# Patient Record
Sex: Male | Born: 2000 | Race: Black or African American | Hispanic: No | Marital: Single | Smoking: Never smoker
Health system: Southern US, Community
[De-identification: ages and names within clinical notes are randomized; demographics above are authoritative.]

---
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
## 2021-02-01 ENCOUNTER — Emergency Department (HOSPITAL_COMMUNITY): Payer: Medicaid Other

## 2021-02-01 ENCOUNTER — Emergency Department (HOSPITAL_COMMUNITY)
Admission: EM | Admit: 2021-02-01 | Discharge: 2021-02-02 | Disposition: A | Discharge status: Home / Self Care | Payer: Medicaid Other | Attending: Emergency Medicine | Admitting: Emergency Medicine

## 2021-02-01 ENCOUNTER — Other Ambulatory Visit: Payer: Self-pay

## 2021-02-01 DIAGNOSIS — S79922A Unspecified injury of left thigh, initial encounter: Secondary | ICD-10-CM | POA: Diagnosis present

## 2021-02-01 DIAGNOSIS — R791 Abnormal coagulation profile: Secondary | ICD-10-CM | POA: Diagnosis not present

## 2021-02-01 DIAGNOSIS — Z23 Encounter for immunization: Secondary | ICD-10-CM | POA: Diagnosis not present

## 2021-02-01 DIAGNOSIS — Z20822 Contact with and (suspected) exposure to covid-19: Secondary | ICD-10-CM | POA: Insufficient documentation

## 2021-02-01 DIAGNOSIS — W3400XA Accidental discharge from unspecified firearms or gun, initial encounter: Secondary | ICD-10-CM | POA: Insufficient documentation

## 2021-02-01 DIAGNOSIS — S31819A Unspecified open wound of right buttock, initial encounter: Secondary | ICD-10-CM | POA: Insufficient documentation

## 2021-02-01 DIAGNOSIS — Y900 Blood alcohol level of less than 20 mg/100 ml: Secondary | ICD-10-CM | POA: Insufficient documentation

## 2021-02-01 DIAGNOSIS — S71102A Unspecified open wound, left thigh, initial encounter: Secondary | ICD-10-CM | POA: Diagnosis not present

## 2021-02-01 DIAGNOSIS — S71132A Puncture wound without foreign body, left thigh, initial encounter: Secondary | ICD-10-CM

## 2021-02-01 DIAGNOSIS — R7989 Other specified abnormal findings of blood chemistry: Secondary | ICD-10-CM

## 2021-02-01 DIAGNOSIS — R7402 Elevation of levels of lactic acid dehydrogenase (LDH): Secondary | ICD-10-CM | POA: Diagnosis not present

## 2021-02-01 DIAGNOSIS — S31813A Puncture wound without foreign body of right buttock, initial encounter: Secondary | ICD-10-CM

## 2021-02-01 LAB — I-STAT CHEM 8, ED
BUN: 6 mg/dL (ref 6–20)
Calcium, Ion: 1.05 mmol/L — ABNORMAL LOW (ref 1.15–1.40)
Chloride: 106 mmol/L (ref 98–111)
Creatinine, Ser: 1.1 mg/dL (ref 0.61–1.24)
Glucose, Bld: 140 mg/dL — ABNORMAL HIGH (ref 70–99)
HCT: 39 % (ref 39.0–52.0)
Hemoglobin: 13.3 g/dL (ref 13.0–17.0)
Potassium: 4 mmol/L (ref 3.5–5.1)
Sodium: 140 mmol/L (ref 135–145)
TCO2: 23 mmol/L (ref 22–32)

## 2021-02-01 MED ORDER — CEFAZOLIN SODIUM-DEXTROSE 2-4 GM/100ML-% IV SOLN
2.0000 g | Freq: Three times a day (TID) | INTRAVENOUS | Status: DC
  Administered 2021-02-01: 2 g via INTRAVENOUS
  Filled 2021-02-01: qty 100

## 2021-02-01 MED ORDER — CEFAZOLIN IN SODIUM CHLORIDE 3-0.9 GM/100ML-% IV SOLN
3.0000 g | Freq: Once | INTRAVENOUS | Status: DC
  Filled 2021-02-01: qty 100

## 2021-02-01 MED ORDER — IOHEXOL 300 MG/ML  SOLN
100.0000 mL | Freq: Once | INTRAMUSCULAR | Status: AC | PRN
  Administered 2021-02-01: 100 mL via INTRAVENOUS

## 2021-02-01 MED ORDER — TETANUS-DIPHTH-ACELL PERTUSSIS 5-2.5-18.5 LF-MCG/0.5 IM SUSY
0.5000 mL | PREFILLED_SYRINGE | Freq: Once | INTRAMUSCULAR | Status: AC
  Administered 2021-02-01: 0.5 mL via INTRAMUSCULAR
  Filled 2021-02-01: qty 0.5

## 2021-02-01 MED ORDER — CEFAZOLIN SODIUM-DEXTROSE 1-4 GM/50ML-% IV SOLN
INTRAVENOUS | Status: AC | PRN
  Administered 2021-02-01: 1 g via INTRAVENOUS
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
## 2021-02-02 ENCOUNTER — Emergency Department (HOSPITAL_COMMUNITY): Payer: Medicaid Other

## 2021-02-02 LAB — CBC
HCT: 42.4 % (ref 39.0–52.0)
Hemoglobin: 13.3 g/dL (ref 13.0–17.0)
MCH: 25.7 pg — ABNORMAL LOW (ref 26.0–34.0)
MCHC: 31.4 g/dL (ref 30.0–36.0)
MCV: 81.9 fL (ref 80.0–100.0)
Platelets: 77 10*3/uL — ABNORMAL LOW (ref 150–400)
RBC: 5.18 MIL/uL (ref 4.22–5.81)
RDW: 12.4 % (ref 11.5–15.5)
WBC: 18 10*3/uL — ABNORMAL HIGH (ref 4.0–10.5)
nRBC: 0 % (ref 0.0–0.2)

## 2021-02-02 LAB — URINALYSIS, ROUTINE W REFLEX MICROSCOPIC
Bacteria, UA: NONE SEEN
Bilirubin Urine: NEGATIVE
Glucose, UA: NEGATIVE mg/dL
Ketones, ur: NEGATIVE mg/dL
Leukocytes,Ua: NEGATIVE
Nitrite: NEGATIVE
Protein, ur: NEGATIVE mg/dL
Specific Gravity, Urine: >1.046 — ABNORMAL HIGH (ref 1.005–1.030)
pH: 5 (ref 5.0–8.0)

## 2021-02-02 LAB — PROTIME-INR
INR: 1.1 (ref 0.8–1.2)
Prothrombin Time: 14.6 seconds (ref 11.4–15.2)

## 2021-02-02 LAB — RESP PANEL BY RT-PCR (FLU A&B, COVID) ARPGX2
Influenza A by PCR: NEGATIVE
Influenza B by PCR: NEGATIVE
SARS Coronavirus 2 by RT PCR: NEGATIVE

## 2021-02-02 LAB — COMPREHENSIVE METABOLIC PANEL
ALT: 15 U/L (ref 0–44)
AST: 22 U/L (ref 15–41)
Albumin: 3.5 g/dL (ref 3.5–5.0)
Alkaline Phosphatase: 66 U/L (ref 38–126)
Anion gap: 11 (ref 5–15)
BUN: 5 mg/dL — ABNORMAL LOW (ref 6–20)
CO2: 22 mmol/L (ref 22–32)
Calcium: 8.5 mg/dL — ABNORMAL LOW (ref 8.9–10.3)
Chloride: 107 mmol/L (ref 98–111)
Creatinine, Ser: 1.22 mg/dL (ref 0.61–1.24)
GFR, Estimated: >60 mL/min (ref >60)
Glucose, Bld: 144 mg/dL — ABNORMAL HIGH (ref 70–99)
Potassium: 4.1 mmol/L (ref 3.5–5.1)
Sodium: 140 mmol/L (ref 135–145)
Total Bilirubin: 0.5 mg/dL (ref 0.3–1.2)
Total Protein: 5.7 g/dL — ABNORMAL LOW (ref 6.5–8.1)

## 2021-02-02 LAB — ETHANOL: Alcohol, Ethyl (B): <10 mg/dL (ref <10)

## 2021-02-02 LAB — SAMPLE TO BLOOD BANK

## 2021-02-02 LAB — LACTIC ACID, PLASMA: Lactic Acid, Venous: 3.2 mmol/L (ref 0.5–1.9)

## 2021-02-02 MED ORDER — SODIUM CHLORIDE 0.9 % IV BOLUS
1000.0000 mL | Freq: Once | INTRAVENOUS | Status: AC
  Administered 2021-02-02: 1000 mL via INTRAVENOUS

## 2021-02-02 MED ORDER — FENTANYL CITRATE (PF) 100 MCG/2ML IJ SOLN
50.0000 ug | Freq: Once | INTRAMUSCULAR | Status: AC
  Administered 2021-02-02: 50 ug via INTRAVENOUS
  Filled 2021-02-02: qty 2

## 2021-02-02 NOTE — ED Notes (Signed)
Transported pt to CT.

## 2021-02-02 NOTE — ED Notes (Addendum)
Gave pt sandwich bag, graham crackers and drink per ok with Dr. Preston Fleeting

## 2021-02-02 NOTE — Progress Notes (Signed)
   02/01/21 2306  Clinical Encounter Type  Visited With Patient not available  Visit Type Initial;Trauma  Referral From Nurse  Consult/Referral To Chaplain   Chaplain responded to Level 1 page. Page came through to on-call phone 30 minutes after it was originally sent. After speaking with Ival Bible, chaplain learned that IT is aware and working on it. Pt being treated and no support person present. Chaplain remains available.  This note was prepared by Chaplain Resident, Tacy Learn, MDiv. Chaplain remains available as needed through the on-call pager: [phone].
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
## 2021-02-02 NOTE — Discharge Instructions (Addendum)
Keep wounds clean.  Keep a dressing over them, as they may drain for the next several days.  Take ibuprofen or naproxen as needed for pain.  If you need additional pain relief, you may add acetaminophen.  Return if you are having any problems.

## 2021-04-15 ENCOUNTER — Encounter: Payer: Self-pay | Admitting: Emergency Medicine

## 2021-04-15 ENCOUNTER — Emergency Department
Admission: EM | Admit: 2021-04-15 | Discharge: 2021-04-15 | Disposition: A | Discharge status: Home / Self Care | Payer: Medicaid Other | Attending: Emergency Medicine | Admitting: Emergency Medicine

## 2021-04-15 ENCOUNTER — Other Ambulatory Visit: Payer: Self-pay

## 2021-04-15 DIAGNOSIS — M79651 Pain in right thigh: Secondary | ICD-10-CM | POA: Insufficient documentation

## 2021-04-15 DIAGNOSIS — M79604 Pain in right leg: Secondary | ICD-10-CM

## 2021-04-15 DIAGNOSIS — Y92410 Unspecified street and highway as the place of occurrence of the external cause: Secondary | ICD-10-CM | POA: Insufficient documentation

## 2021-04-15 MED ORDER — CYCLOBENZAPRINE HCL 5 MG PO TABS: 5.0000 mg | ORAL_TABLET | Freq: Three times a day (TID) | ORAL | 0 refills | Status: DC | PRN

## 2021-04-15 MED ORDER — CYCLOBENZAPRINE HCL 10 MG PO TABS
10.0000 mg | ORAL_TABLET | Freq: Once | ORAL | Status: AC
  Administered 2021-04-15: 10 mg via ORAL
  Filled 2021-04-15: qty 1

## 2021-04-15 NOTE — Discharge Instructions (Addendum)
Your exam is reassuring as it shows no serious injury to your right leg.  You may take over-the-counter ibuprofen as needed for anti-inflammatory pain relief.  Take the prescription muscle relaxants as needed.  Apply ice or heat to reduce discomfort.  Follow-up with your primary provider for ongoing symptoms.
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
## 2023-05-08 ENCOUNTER — Encounter: Payer: Self-pay | Admitting: Emergency Medicine

## 2023-05-08 ENCOUNTER — Emergency Department
Admission: EM | Admit: 2023-05-08 | Discharge: 2023-05-08 | Disposition: A | Discharge status: Home / Self Care | Payer: BC Managed Care – PPO | Attending: Emergency Medicine | Admitting: Emergency Medicine

## 2023-05-08 ENCOUNTER — Other Ambulatory Visit: Payer: Self-pay

## 2023-05-08 DIAGNOSIS — R030 Elevated blood-pressure reading, without diagnosis of hypertension: Secondary | ICD-10-CM | POA: Insufficient documentation

## 2023-05-08 DIAGNOSIS — R42 Dizziness and giddiness: Secondary | ICD-10-CM | POA: Diagnosis present

## 2023-05-08 LAB — COMPREHENSIVE METABOLIC PANEL
ALT: 23 U/L (ref 0–44)
AST: 26 U/L (ref 15–41)
Albumin: 4.3 g/dL (ref 3.5–5.0)
Alkaline Phosphatase: 78 U/L (ref 38–126)
Anion gap: 9 (ref 5–15)
BUN: 9 mg/dL (ref 6–20)
CO2: 24 mmol/L (ref 22–32)
Calcium: 8.9 mg/dL (ref 8.9–10.3)
Chloride: 104 mmol/L (ref 98–111)
Creatinine, Ser: 1.03 mg/dL (ref 0.61–1.24)
GFR, Estimated: >60 mL/min (ref >60)
Glucose, Bld: 103 mg/dL — ABNORMAL HIGH (ref 70–99)
Potassium: 3.5 mmol/L (ref 3.5–5.1)
Sodium: 137 mmol/L (ref 135–145)
Total Bilirubin: 0.6 mg/dL (ref 0.3–1.2)
Total Protein: 7.8 g/dL (ref 6.5–8.1)

## 2023-05-08 LAB — CBC
HCT: 45.2 % (ref 39.0–52.0)
Hemoglobin: 15.1 g/dL (ref 13.0–17.0)
MCH: 25.6 pg — ABNORMAL LOW (ref 26.0–34.0)
MCHC: 33.4 g/dL (ref 30.0–36.0)
MCV: 76.6 fL — ABNORMAL LOW (ref 80.0–100.0)
Platelets: 314 10*3/uL (ref 150–400)
RBC: 5.9 MIL/uL — ABNORMAL HIGH (ref 4.22–5.81)
RDW: 12.1 % (ref 11.5–15.5)
WBC: 8.5 10*3/uL (ref 4.0–10.5)
nRBC: 0 % (ref 0.0–0.2)

## 2023-05-08 NOTE — ED Provider Notes (Signed)
[facility] Provider Note    Event Date/Time   First MD Initiated Contact with Patient 05/08/23 [phone]     (approximate)   History   Dizziness   HPI  Shane Keith is a 22 y.o. male who presents with complaints of dizziness.  Patient reports this has been ongoing intermittently over the past month, typically notes that when he gets up to go to the bathroom in the middle of the night.  This morning he had it when he got up and a little bit at work.  He reports feeling lightheaded but denies a sensation of feeling like he is going to faint or pass out.  No palpitations or chest pain.  No neurodeficits.  Reports intermittent mild headaches but nothing unusual for him.  No change in vision.  No ear pain.  Mother is concerned about his sugars possibly being low     Physical Exam   Triage Vital Signs: ED Triage Vitals  Encounter Vitals Group     BP 05/08/23 0717 (!) 150/82     Systolic BP Percentile --      Diastolic BP Percentile --      Pulse Rate 05/08/23 0717 64     Resp 05/08/23 0717 16     Temp 05/08/23 0717 98.5 F (36.9 C)     Temp Source 05/08/23 0717 Oral     SpO2 05/08/23 0717 100 %     Weight 05/08/23 0716 108.9 kg (240 lb 1.3 oz)     Height 05/08/23 0716 1.727 m (5\' 8" )     Head Circumference --      Peak Flow --      Pain Score 05/08/23 0716 0     Pain Loc --      Pain Education --      Exclude from Growth Chart --     Most recent vital signs: Vitals:   05/08/23 0717  BP: (!) 150/82  Pulse: 64  Resp: 16  Temp: 98.5 F (36.9 C)  SpO2: 100%     General: Awake, no distress.  CV:  Good peripheral perfusion.  Resp:  Normal effort.  Abd:  No distention.  Other:  Normal strength in all extremities, ambulates well, PERRLA, EOMI, mild discomfort on right gaze   ED Results / Procedures / Treatments   Labs (all labs ordered are listed, but only abnormal results are displayed) Labs Reviewed  CBC - Abnormal; Notable for the  following components:      Result Value   RBC 5.90 (*)    MCV 76.6 (*)    MCH 25.6 (*)    All other components within normal limits  COMPREHENSIVE METABOLIC PANEL - Abnormal; Notable for the following components:   Glucose, Bld 103 (*)    All other components within normal limits     EKG  ED ECG REPORT I, Jene Every, the attending physician, personally viewed and interpreted this ECG.  Date: 05/08/2023  Rhythm: normal sinus rhythm QRS Axis: normal Intervals: normal ST/T Wave abnormalities: normal Narrative Interpretation: no evidence of acute ischemia    RADIOLOGY     PROCEDURES:  Critical Care performed:   Procedures   MEDICATIONS ORDERED IN ED: Medications - No data to display   IMPRESSION / MDM / ASSESSMENT AND PLAN / ED COURSE  I reviewed the triage vital signs and the nursing notes. Patient's presentation is most consistent with acute complicated illness / injury requiring diagnostic workup.  Patient presents with  dizziness versus vertigo as detailed above.  Overall well-appearing and in no acute distress.  I suspect vertigo given symptoms that occur primarily when changing positions rapidly.  No neurodeficits to suggest more concerning etiology.  Will check BMP, CBC to evaluate electrolytes, glucose  Lab work reviewed and is reassuring, on reevaluation patient is asymptomatic and well-appearing, no indication for further workup here, have discussed with mother and patient about close follow-up with PCP for blood pressure recheck and further evaluation as needed.  They agree with this plan.      FINAL CLINICAL IMPRESSION(S) / ED DIAGNOSES   Final diagnoses:  Dizziness  Elevated blood pressure reading     Rx / DC Orders   ED Discharge Orders     None        Note:  This document was prepared using Dragon voice recognition software and may include unintentional dictation errors.   Jene Every, MD 05/08/23 [phone]

## 2023-05-08 NOTE — ED Notes (Signed)
See triage note States he has been having dizzy spells for about 1 month  States he notices them when he gets up from lying flat  States the room does not spin  No fever or n/v  Denies any trauma

## 2023-05-08 NOTE — ED Triage Notes (Signed)
C/O  intermittent lightheadedness and dizziness x 1 month.  States when woke up this morning felt lightheaded.  AAOx3. Skin warm and dry. NAD. Gait steady. MAE

## 2024-07-25 ENCOUNTER — Ambulatory Visit: Payer: Self-pay | Admitting: Family Medicine

## 2024-07-25 DIAGNOSIS — Z113 Encounter for screening for infections with a predominantly sexual mode of transmission: Secondary | ICD-10-CM

## 2024-07-25 LAB — HM HIV SCREENING LAB: HM HIV Screening: NEGATIVE

## 2024-07-25 NOTE — Progress Notes (Signed)
 " [facility] Department STI clinic [address] [place] [address] Main phone: [phone]  STI screening visit  Subjective:  Shane Keith is a 23 y.o. male being seen today for an STI screening visit. The patient reports they do have symptoms.    Patient has the following medical conditions:  There are no active problems to display for this patient.  Chief Complaint  Patient presents with   SEXUALLY TRANSMITTED DISEASE    HPI Patient reports to clinic with a hx of 4 days of dysuria. Denies other symptoms, denies hx of STDS  Reproductive considerations: Does the patient or their partner desires a pregnancy in the next year? No  See flowsheet for further details and programmatic requirements  Hyperlink available at the top of the signed note in blue.  Flow sheet content below:  Pregnancy Intention Screening Does the patient want to become pregnant in the next year?: N/A Does the patient's partner want to become pregnant in the next year?: No Would the patient like to discuss contraceptive options today?: N/A All Patients Anyone smoke around pt and/or pt's children?: No Anyone smoke inside pt's house?: No Anyone smoke inside car?: No Anyone smoke inside the workplace?: No Reason For STD Screen STD Screening: Has symptoms Have you ever had an STD?: No History of Antibiotic use in the past 2 weeks?: No STD Symptoms Dysuria: Yes Dysuria s/s: x 4 days Risk Factors for Hep B Household, sexual, or needle sharing contact of a person infected with Hep B: No Sexual contact with a person who uses drugs not as prescribed?: No Currently or Ever used drugs not as prescribed: No HIV Positive: No PRep Patient: No Men who have sex with men: No Have Hepatitis C: No History of Incarceration: No History of Homeslessness?: No Anal sex following anal drug use?: No Risk Factors for Hep C Currently using drugs not as prescribed: No Sexual  partner(s) currently using drugs as not prescribed: No History of drug use: No HIV Positive: No People with a history of incarceration: No People born between the years of 80 and 12: No Abuse History Has patient ever been abused physically?: No Has patient ever been abused sexually?: No Does patient feel they have a problem with Anxiety?: No Does patient feel they have a problem with Depression?: No Counseling Patient counseled to use condoms with all sex: Condoms declined RTC in 2-3 weeks for test results: Yes Clinic will call if test results abnormal before test result appt.: Yes Test results given to patient Patient counseled to use condoms with all sex: Condoms declined  Screening for MPX risk:  Unexplained rash?  No   MSM?  No   Multiple or anonymous sex partners?  Yes   Any close or sexual contact with a person  diagnosed with MPX?  No   Any outside the [place]  where MPX is endemic?  No   High clinical suspicion for MPX?    -Unlikely to be chickenpox    -Lymphadenopathy    -Rash that presents in same phase of       evolution on any given body part  No   Does this patient meet CDC recommendations for vaccination against MPOX? No  You already have or anticipate having the following risks:  Your sex partner has the following risks: You're traveling to a county with a clade I MPOX outbreak and anticipate these risks: Occupational exposure  You had known or suspected exposure to someone with  monkeypox You had a sex partner in the past 2 weeks who was diagnosed with monkeypox You are a gay, bisexual, or other man who has sex with men, or are transgender or nonbinary and in the past 6 months have had any of the following: - A new diagnosis of one or more sexually transmitted diseases (e.g., chlamydia, gonorrhea, or syphilis) - More than one sex partner You have had any of the following in the past 6 months: - Sex at a commercial sex venue (like a sex club or bathhouse) - Sex  related to a large commercial event   or in a geographic area (city or county for example) where mpox virus transmission is occurring Sex with a new partner Sex at a commercial sex venue (e.g., a sex club or bathhouse) Sex in it consultant for money, goods, drugs, or other trade Sex in association with a large public event (e.g., a rave, party, or festival) i.e. certain people who work in a laboratory or healthcare facility   Infectious disease screenings: Vaccinated against HPV? Unknown  HIV Ever had a positive? Unknown Last test: none Results in chart:  No results found for: HMHIVSCREEN No results found for: HIV   Hep B Hep B status: unknown or no prior testing Received HBV vaccination? Unknown Received HBV testing for immunity? Unknown Results in chart:  No components found for: HMHEPBSCREEN  Do they qualify for HBV screening today? No Criteria:  -Household, sexual or needle sharing contact with HBV -History of drug use or homelessness -HIV positive -Those with known Hep C  Hep C Hep C status: unknown or no prior testing Results in chart:  No results found for: HMHEPCSCREEN No components found for: HEPC  Do they qualify for HCV screening today? No Criteria - since the last HCV result, does the patient have any of the following? - Current drug use - Have a partner with drug use - Has been incarcerated  Immunization history:  Immunization History  Administered Date(s) Administered   Tdap 12/18/2020, 02/01/2021    The following portions of the patient's history were reviewed and updated as appropriate: allergies, current medications, past medical history, past social history, past surgical history and problem list.  Substance use screenings:  Uses tobacco products? No Uses vapes? No Uses alcohol? No Uses non-injectable substances that alter your mental status? No Uses non-prescribed injectable substances? No  Immunization History  Administered Date(s)  Administered   Tdap 12/18/2020, 02/01/2021    The following portions of the patient's history were reviewed and updated as appropriate: allergies, current medications, past medical history, past social history, past surgical history and problem list.  Objective:  There were no vitals filed for this visit.  Physical Exam Vitals and nursing note reviewed.  Constitutional:      Appearance: Normal appearance.  HENT:     Head: Normocephalic and atraumatic.     Comments: No nits or hair loss of scalp, brows, and lashes    Mouth/Throat:     Mouth: Mucous membranes are moist.     Pharynx: Oropharynx is clear. No oropharyngeal exudate or posterior oropharyngeal erythema.  Eyes:     General:        Right eye: No discharge.        Left eye: No discharge.     Conjunctiva/sclera: Conjunctivae normal.     Right eye: Right conjunctiva is not injected. No exudate.    Left eye: Left conjunctiva is not injected. No exudate. Pulmonary:     Effort:  Pulmonary effort is normal.  Abdominal:     Palpations: There is no hepatomegaly.     Tenderness: There is no abdominal tenderness. There is no rebound.  Genitourinary:    Comments: Politely declined genital exam. Lymphadenopathy:     Cervical: No cervical adenopathy.     Upper Body:     Right upper body: No supraclavicular, axillary or epitrochlear adenopathy.     Left upper body: No supraclavicular, axillary or epitrochlear adenopathy.     Comments: Patient declines genital exam. Inguinal lymph nodes not assessed.   Skin:    General: Skin is warm and dry.     Findings: No lesion or rash.  Neurological:     Mental Status: He is alert and oriented to person, place, and time.      Assessment and Plan:  Shane Keith is a 23 y.o. male presenting to the [facility] Department for STI screening.  Patient accepted the following screenings: urine CT/GC, HIV, and RPR  1. Screening for venereal disease (Primary) -reviewed with patient  strong recommendation for gram stain and exam given patient's complaint of dysuria and upcoming holiday next week -reviewed he might not have results until after the holiday -declined gram stain and exam after discussion  - HIV [facility] - Syphilis Serology, [facility] - Chlamydia/GC NAA, Confirmation   Counseling: Recommended condom use with all sex Discussed importance of condom use for STI prevention Discussed time line for [facility] results and that patient will be called with positive results and encouraged patient to call if they had not heard in 2 weeks Recommended repeat testing in 3 months with positive results. Recommended returning for continued or worsening symptoms.   Return if symptoms worsen or fail to improve, for STI screening.  No future appointments.  Verneta Bers, [place] "

## 2024-07-25 NOTE — Progress Notes (Signed)
 Pt is here STD screening. Condoms given. Sonda Primes, RN.

## 2024-07-30 LAB — CHLAMYDIA/GC NAA, CONFIRMATION
Chlamydia trachomatis, NAA: NEGATIVE
Neisseria gonorrhoeae, NAA: POSITIVE — AB

## 2024-07-30 LAB — N. GONORRHOEAE NAA, CONFIRM: N. gonorrhoeae NAA, Confirm: POSITIVE — AB

## 2024-07-31 ENCOUNTER — Emergency Department

## 2024-07-31 ENCOUNTER — Other Ambulatory Visit: Payer: Self-pay

## 2024-07-31 ENCOUNTER — Emergency Department
Admission: EM | Admit: 2024-07-31 | Discharge: 2024-07-31 | Disposition: A | Discharge status: Home / Self Care | Attending: Emergency Medicine | Admitting: Emergency Medicine

## 2024-07-31 DIAGNOSIS — Y92410 Unspecified street and highway as the place of occurrence of the external cause: Secondary | ICD-10-CM | POA: Diagnosis not present

## 2024-07-31 DIAGNOSIS — Q741 Congenital malformation of knee: Secondary | ICD-10-CM | POA: Diagnosis not present

## 2024-07-31 DIAGNOSIS — M25562 Pain in left knee: Secondary | ICD-10-CM | POA: Insufficient documentation

## 2024-07-31 DIAGNOSIS — S76912A Strain of unspecified muscles, fascia and tendons at thigh level, left thigh, initial encounter: Secondary | ICD-10-CM | POA: Diagnosis not present

## 2024-07-31 DIAGNOSIS — S79912A Unspecified injury of left hip, initial encounter: Secondary | ICD-10-CM | POA: Diagnosis present

## 2024-07-31 MED ORDER — IBUPROFEN 800 MG PO TABS: 800.0000 mg | ORAL_TABLET | Freq: Three times a day (TID) | ORAL | 0 refills | Status: AC | PRN

## 2024-07-31 MED ORDER — ACETAMINOPHEN 500 MG PO TABS: 1000.0000 mg | ORAL_TABLET | Freq: Three times a day (TID) | ORAL | 0 refills | Status: AC | PRN
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
## 2024-08-04 ENCOUNTER — Ambulatory Visit: Payer: Self-pay

## 2024-08-04 NOTE — Progress Notes (Signed)
 Attempted to contact pt regarding positive gonorrhea test result from 07/25/24. No answer LMTRC. Pt needs to be scheduled for STI tx.for gonorrhea.
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
## 2024-08-08 NOTE — Telephone Encounter (Signed)
 Call placed to emergency contact Jeananne Bergeron . Pt's mother states she has not spoken to him and unable to contact him. He usually stays at his father's. Will attempt to contact [phone] to reach pt.

## 2024-08-08 NOTE — Telephone Encounter (Signed)
 Attempted to contact pt. No answer LMTRC. Will mail letter to patient.

## 2024-08-11 NOTE — Progress Notes (Signed)
"  Letter was mailed to patient   "

## 2024-08-22 ENCOUNTER — Ambulatory Visit: Payer: Self-pay

## 2024-08-22 DIAGNOSIS — A549 Gonococcal infection, unspecified: Secondary | ICD-10-CM

## 2024-08-22 MED ORDER — CEFTRIAXONE SODIUM 500 MG IJ SOLR
500.0000 mg | Freq: Once | INTRAMUSCULAR | Status: AC
  Administered 2024-08-22: 500 mg via INTRAMUSCULAR

## 2024-08-22 NOTE — Progress Notes (Signed)
 Pt is here for Gonorrhea treatment. Ceftriaxone  injection ordered and given to pt at the [facility]. Pt tolerated well to injection with no complications. Brochure, Contact card given and condoms declined. Wilkie Drought, RN.
